# Patient Record
Sex: Female | Born: 1980 | Race: White | Hispanic: No | Marital: Married
Health system: Southern US, Community
[De-identification: ages and names within clinical notes are randomized; demographics above are authoritative.]

---

## 2021-06-03 ENCOUNTER — Emergency Department (HOSPITAL_COMMUNITY)
Admission: EM | Admit: 2021-06-03 | Discharge: 2021-06-03 | Disposition: A | Discharge status: Home / Self Care | Payer: Self-pay | Attending: Emergency Medicine | Admitting: Emergency Medicine

## 2021-06-03 ENCOUNTER — Emergency Department (HOSPITAL_COMMUNITY): Payer: Self-pay

## 2021-06-03 ENCOUNTER — Other Ambulatory Visit: Payer: Self-pay

## 2021-06-03 ENCOUNTER — Encounter (HOSPITAL_COMMUNITY): Payer: Self-pay | Admitting: Emergency Medicine

## 2021-06-03 DIAGNOSIS — R52 Pain, unspecified: Secondary | ICD-10-CM

## 2021-06-03 DIAGNOSIS — L02419 Cutaneous abscess of limb, unspecified: Secondary | ICD-10-CM

## 2021-06-03 DIAGNOSIS — L02411 Cutaneous abscess of right axilla: Secondary | ICD-10-CM | POA: Insufficient documentation

## 2021-06-03 LAB — CBC WITH DIFFERENTIAL/PLATELET
Abs Immature Granulocytes: 0.05 10*3/uL (ref 0.00–0.07)
Basophils Absolute: 0.1 10*3/uL (ref 0.0–0.1)
Basophils Relative: 1 %
Eosinophils Absolute: 0.1 10*3/uL (ref 0.0–0.5)
Eosinophils Relative: 1 %
HCT: 33.5 % — ABNORMAL LOW (ref 36.0–46.0)
Hemoglobin: 10.8 g/dL — ABNORMAL LOW (ref 12.0–15.0)
Immature Granulocytes: 1 %
Lymphocytes Relative: 18 %
Lymphs Abs: 1.7 10*3/uL (ref 0.7–4.0)
MCH: 28.6 pg (ref 26.0–34.0)
MCHC: 32.2 g/dL (ref 30.0–36.0)
MCV: 88.6 fL (ref 80.0–100.0)
Monocytes Absolute: 1.2 10*3/uL — ABNORMAL HIGH (ref 0.1–1.0)
Monocytes Relative: 12 %
Neutro Abs: 6.5 10*3/uL (ref 1.7–7.7)
Neutrophils Relative %: 67 %
Platelets: 312 10*3/uL (ref 150–400)
RBC: 3.78 MIL/uL — ABNORMAL LOW (ref 3.87–5.11)
RDW: 12.5 % (ref 11.5–15.5)
WBC: 9.6 10*3/uL (ref 4.0–10.5)
nRBC: 0 % (ref 0.0–0.2)

## 2021-06-03 LAB — URINALYSIS, ROUTINE W REFLEX MICROSCOPIC
Bilirubin Urine: NEGATIVE
Glucose, UA: NEGATIVE mg/dL
Hgb urine dipstick: NEGATIVE
Ketones, ur: NEGATIVE mg/dL
Nitrite: NEGATIVE
Protein, ur: 30 mg/dL — AB
Specific Gravity, Urine: 1.027 (ref 1.005–1.030)
Squamous Epithelial / HPF: >50 — ABNORMAL HIGH (ref 0–5)
pH: 5 (ref 5.0–8.0)

## 2021-06-03 LAB — APTT: aPTT: 30 seconds (ref 24–36)

## 2021-06-03 LAB — RAPID URINE DRUG SCREEN, HOSP PERFORMED
Amphetamines: POSITIVE — AB
Barbiturates: NOT DETECTED
Benzodiazepines: NOT DETECTED
Cocaine: POSITIVE — AB
Opiates: NOT DETECTED
Tetrahydrocannabinol: POSITIVE — AB

## 2021-06-03 LAB — COMPREHENSIVE METABOLIC PANEL
ALT: 6 U/L (ref 0–44)
AST: 12 U/L — ABNORMAL LOW (ref 15–41)
Albumin: 3.3 g/dL — ABNORMAL LOW (ref 3.5–5.0)
Alkaline Phosphatase: 76 U/L (ref 38–126)
Anion gap: 6 (ref 5–15)
BUN: 10 mg/dL (ref 6–20)
CO2: 28 mmol/L (ref 22–32)
Calcium: 9 mg/dL (ref 8.9–10.3)
Chloride: 102 mmol/L (ref 98–111)
Creatinine, Ser: 0.63 mg/dL (ref 0.44–1.00)
GFR, Estimated: >60 mL/min (ref >60)
Glucose, Bld: 113 mg/dL — ABNORMAL HIGH (ref 70–99)
Potassium: 2.9 mmol/L — ABNORMAL LOW (ref 3.5–5.1)
Sodium: 136 mmol/L (ref 135–145)
Total Bilirubin: 0.3 mg/dL (ref 0.3–1.2)
Total Protein: 7.4 g/dL (ref 6.5–8.1)

## 2021-06-03 LAB — I-STAT BETA HCG BLOOD, ED (MC, WL, AP ONLY): I-stat hCG, quantitative: <5 m[IU]/mL (ref <5)

## 2021-06-03 LAB — LACTIC ACID, PLASMA
Lactic Acid, Venous: 0.9 mmol/L (ref 0.5–1.9)
Lactic Acid, Venous: 0.7 mmol/L (ref 0.5–1.9)

## 2021-06-03 LAB — PROTIME-INR
INR: 1.1 (ref 0.8–1.2)
Prothrombin Time: 14.5 seconds (ref 11.4–15.2)

## 2021-06-03 MED ORDER — LIDOCAINE-EPINEPHRINE (PF) 2 %-1:200000 IJ SOLN
10.0000 mL | Freq: Once | INTRAMUSCULAR | Status: AC
  Administered 2021-06-03: 10 mL
  Filled 2021-06-03: qty 20

## 2021-06-03 MED ORDER — LACTATED RINGERS IV SOLN: INTRAVENOUS | Status: DC

## 2021-06-03 MED ORDER — CEPHALEXIN 500 MG PO CAPS: 500.0000 mg | ORAL_CAPSULE | Freq: Four times a day (QID) | ORAL | 0 refills | Status: AC

## 2021-06-03 MED ORDER — CEPHALEXIN 250 MG PO CAPS
500.0000 mg | ORAL_CAPSULE | Freq: Once | ORAL | Status: AC
  Administered 2021-06-03: 500 mg via ORAL
  Filled 2021-06-03: qty 2

## 2021-06-03 MED ORDER — HYDROCODONE-ACETAMINOPHEN 5-325 MG PO TABS
1.0000 | ORAL_TABLET | Freq: Once | ORAL | Status: AC
  Administered 2021-06-03: 1 via ORAL
  Filled 2021-06-03: qty 1

## 2021-06-03 MED ORDER — DEXTROSE 5 % IV SOLN
1500.0000 mg | Freq: Once | INTRAVENOUS | Status: AC
  Administered 2021-06-03: 1500 mg via INTRAVENOUS
  Filled 2021-06-03: qty 75

## 2021-06-03 MED ORDER — FENTANYL CITRATE (PF) 100 MCG/2ML IJ SOLN
50.0000 ug | Freq: Once | INTRAMUSCULAR | Status: AC
  Administered 2021-06-03: 50 ug via INTRAVENOUS
  Filled 2021-06-03: qty 2

## 2021-06-03 NOTE — Progress Notes (Signed)
Pharmacy Note:  Dalbavancin for Acute Bacterial Skin and Skin Structure Infection (ABSSSI) Patients to [facility] Discharge Taylor Compton is an 40 y.o. female who presented to [facility] on 06/03/2021 with an Acute Bacterial Skin and Skin Structure Infection  Inclusion criteria - Indication []  Moderately large skin lesion (>=75 cm2 or larger - about the size of a baseball) [x]  Cellulitis  Inclusion Criteria - at least one SIRS criteria present []  WBC > 12,000 or < 4000 []  temp >100.9 or < 96.8 [x]  heart rate >90[]  respiratory rate >20  Patient was evaluated for the following exclusion criteria and no exclusions were found  Hardware involvement, Hypotension / shock, Elevated lactate (>2) without other explanation, ram-negative infection risk factors (bites, water exposure, infection after trauma, infection after skin graft, neutropenia, burns, severe immunocompromise), necrotizing fasciitis possible or confirmed, Known or suspected osteomyelitis or septic arthritis, endocarditis, diabetic foot infection, ischemic ulcers, post-operative wound infection, perirectal infections, need for drainage in the operating room, hand or facial infections, injection drug users with a fever, bacteremia, pregnancy or breastfeeding, allergy to related antibiotics like vancomycin, known liver disease (t.bili >2x ULN or AST/ALT 3x ULN)  , PharmD Clinical Pharmacist  06/03/2021, 8:07 AM Clinical Pharmacist

## 2021-06-03 NOTE — ED Notes (Signed)
Patient called for xray by xray tech and sort tech. Patient not visible in lobby

## 2021-06-03 NOTE — ED Notes (Signed)
Pt not in ED room 20

## 2021-06-03 NOTE — Discharge Instructions (Signed)
You are seen in the ER for abscess to the axillary region.  We drained the abscess.  You have received IV antibiotic, which should help with the cellulitis treatment.  Additionally, take Keflex for the next 5 days.  We would like you to follow-up with the infectious disease doctors as indicated in your paperwork.  Return to the ER if you start having fevers, chills, worsening pain, worsening swelling.  For now, warm compresses 3 or 4 times a day for 15 to 30 minutes should be helpful at the site of the abscess drainage.  Keep the wound site clean and dry.

## 2021-06-03 NOTE — ED Triage Notes (Addendum)
Pt reports an abscess that appeared three days ago under her right arm.  The arm pain and swelling and redness has moved down to her elbow which she found it hard to open.    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  No fevers, chills or other symptoms.

## 2021-06-03 NOTE — ED Notes (Signed)
Received pt to ED room 20

## 2021-06-03 NOTE — ED Provider Notes (Signed)
Emergency Medicine Provider Triage Evaluation Note  Taylor Compton , a 40 y.o. female  was evaluated in triage.  Pt complains of right arm pain and swelling. Pt reports she initially had swelling and pain in the right axilla 3 days ago.  Pt states she tried to squeeze it but nothing came out.  Today redness, pain and warmth have extended down the right upper arm to the right elbow.  Pt with decreased ROM of the right elbow.  Last IVDU was 1 week ago.  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  Associated nausea and lightheadedness today.  NO syncope  Review of Systems  Positive: Redness, elbow pain, lightheadedness, nausea Negative: Vomiting, fevers, weakness  Physical Exam  BP 111/73 (BP Location: Left Arm)   Pulse 91   Temp 98.5 F (36.9 C) (Oral)   Resp 16   Ht 5\' 5"  (1.651 m)   Wt 53.5 kg   SpO2 96%   BMI 19.64 kg/m   Gen:   Awake, no distress  Resp:  Normal effort  MSK:   Decreased ROM of the right elbow due to severe pain Other:  Redness, tenderness and induration of the right axilla with extending erythema to the right upper arm  Medical Decision Making  Medically screening exam initiated at 2:59 AM.  Appropriate orders placed.  Taylor Compton was informed that the remainder of the evaluation will be completed by another provider, this initial triage assessment does not replace that evaluation, and the importance of remaining in the ED until their evaluation is complete.  Concern for evolving sepsis 2/2 IVDU.  Labs, blood cultures and imaging placed.  Vitals within normal limits at this time. No code sepsis called.   Taylor Compton, Taylor Compton 06/03/21 08/03/21    [id number], MD 06/03/21 [phone]

## 2021-06-03 NOTE — ED Provider Notes (Addendum)
[facility] EMERGENCY DEPARTMENT Provider Note   CSN: [id number] Arrival date & time: 06/03/21  0039     History Chief Complaint  Patient presents with  . Abscess    Taylor Compton is a 40 y.o. female.  HPI     40 year old female comes in a chief complaint of abscess and arm pain.  She has history of polysubstance abuse.  Patient reports that she started noticing some swelling below her armpit 3 days ago.  2 days ago there was a whitehead that she drained pus from.  Yesterday she started having increased swelling and redness that is now progressed up to the elbow, prompting her to come to the ER.  Review of system is negative for any nausea, vomiting, fevers, chills.  History reviewed. No pertinent past medical history.  There are no problems to display for this patient.   History reviewed. No pertinent surgical history.   OB History   No obstetric history on file.     No family history on file.     Home Medications Prior to Admission medications   Medication Sig Start Date End Date Taking? Authorizing Provider  cephALEXin (KEFLEX) 500 MG capsule Take 1 capsule (500 mg total) by mouth 4 (four) times daily. 06/03/21  Yes Derwood Kaplan, MD    Allergies    Patient has no known allergies.  Review of Systems   Review of Systems  Constitutional: Positive for activity change.  Musculoskeletal: Positive for arthralgias.  Skin: Positive for wound.  Allergic/Immunologic: Negative for immunocompromised state.  Neurological: Positive for numbness.  Hematological: Does not bruise/bleed easily.  All other systems reviewed and are negative.   Physical Exam Updated Vital Signs BP 110/77 (BP Location: Left Arm)   Pulse 86   Temp 98.6 F (37 C) (Oral)   Resp 14   Ht 5\' 5"  (1.651 m)   Wt 53.5 kg   SpO2 100%   BMI 19.64 kg/m   Physical Exam Vitals and nursing note reviewed.  Constitutional:      Appearance: She is well-developed.  HENT:      Head: Normocephalic and atraumatic.  Cardiovascular:     Rate and Rhythm: Normal rate.  Pulmonary:     Effort: Pulmonary effort is normal.  Abdominal:     General: Bowel sounds are normal.  Musculoskeletal:        General: Normal range of motion.     Cervical back: Normal range of motion and neck supple.     Comments: Patient has fluctuance over the axillary region at two  different sites and induration above those fluctuant lesions.  Skin:    General: Skin is warm and dry.     Findings: Erythema and rash present.     Comments: Erythematous rash from axilla to elbow, not circumferential  Neurological:     Mental Status: She is alert and oriented to person, place, and time.     ED Results / Procedures / Treatments   Labs (all labs ordered are listed, but only abnormal results are displayed) Labs Reviewed  COMPREHENSIVE METABOLIC PANEL - Abnormal; Notable for the following components:      Result Value   Potassium 2.9 (*)    Glucose, Bld 113 (*)    Albumin 3.3 (*)    AST 12 (*)    All other components within normal limits  CBC WITH DIFFERENTIAL/PLATELET - Abnormal; Notable for the following components:   RBC 3.78 (*)    Hemoglobin 10.8 (*)  HCT 33.5 (*)    Monocytes Absolute 1.2 (*)    All other components within normal limits  URINALYSIS, ROUTINE W REFLEX MICROSCOPIC - Abnormal; Notable for the following components:   Color, Urine AMBER (*)    APPearance TURBID (*)    Protein, ur 30 (*)    Leukocytes,Ua TRACE (*)    Bacteria, UA MANY (*)    Squamous Epithelial / LPF >50 (*)    All other components within normal limits  RAPID URINE DRUG SCREEN, HOSP PERFORMED - Abnormal; Notable for the following components:   Cocaine POSITIVE (*)    Amphetamines POSITIVE (*)    Tetrahydrocannabinol POSITIVE (*)    All other components within normal limits  CULTURE, BLOOD (SINGLE)  URINE CULTURE  LACTIC ACID, PLASMA  LACTIC ACID, PLASMA  PROTIME-INR  APTT  I-STAT BETA HCG  BLOOD, ED ([facility], [facility], [facility] ONLY)    EKG EKG Interpretation  Date/Time:  Sunday June 03 2021 08:37:05 EDT Ventricular Rate:  67 PR Interval:  176 QRS Duration: 91 QT Interval:  445 QTC Calculation: 470 R Axis:   89 Text Interpretation: Sinus rhythm No acute changes No significant change since last tracing Confirmed by Derwood Kaplan ([id number]) on 06/03/2021 10:15:05 AM   Radiology DG Chest 1 View  Result Date: 06/03/2021 CLINICAL DATA:  40 year old female with chest pain and possible sepsis. Reports abscess under right arm. EXAM: CHEST  1 VIEW COMPARISON:  None. FINDINGS: Seated upright AP view at 0352 hours. Normal lung volumes and mediastinal contours. Visualized tracheal air column is within normal limits. Allowing for portable technique the lungs are clear. No pneumothorax or pleural effusion. No osseous abnormality identified. Negative visible bowel gas pattern. No right axillary soft tissue gas. IMPRESSION: Negative portable chest. Electronically Signed   By: Odessa Fleming M.D.   On: 06/03/2021 04:46   DG Elbow Complete Right  Result Date: 06/03/2021 CLINICAL DATA:  40 year old female with chest pain and possible sepsis. Reports abscess under right arm. EXAM: RIGHT ELBOW - COMPLETE 3+ VIEW COMPARISON:  None. FINDINGS: Bone mineralization is within normal limits. There is no evidence of fracture, dislocation, or joint effusion. There is no evidence of arthropathy or other focal bone abnormality. Soft tissue swelling and stranding along the medial and dorsal distal humerus. No soft tissue gas. No radiopaque foreign body identified. IMPRESSION: Soft tissue swelling and stranding medial and dorsal to the distal humerus. No acute osseous abnormality identified. Electronically Signed   By: Odessa Fleming M.D.   On: 06/03/2021 04:47    Procedures .Marland KitchenIncision and Drainage  Date/Time: 06/03/2021 10:18 AM Performed by: Derwood Kaplan, MD Authorized by: Derwood Kaplan, MD   Consent:    Consent obtained:  Verbal    Consent given by:  Patient   Risks discussed:  Bleeding, incomplete drainage and pain   Alternatives discussed:  No treatment Universal protocol:    Procedure explained and questions answered to patient or proxy's satisfaction: yes     Patient identity confirmed:  Arm band Location:    Type:  Abscess   Size:  4   Location:  Upper extremity   Upper extremity location: AXILLA. Pre-procedure details:    Skin preparation:  Chlorhexidine Sedation:    Sedation type:  None Anesthesia:    Anesthesia method:  Topical application and local infiltration   Local anesthetic:  Lidocaine 1% WITH epi Procedure type:    Complexity:  Complex Procedure details:    Ultrasound guidance: no     Incision types:  Stab incision   Incision depth:  Subcutaneous   Wound management:  Probed and deloculated, irrigated with saline and extensive cleaning   Drainage:  Purulent and serosanguinous   Drainage amount:  Copious   Packing materials:  None Post-procedure details:    Procedure completion:  Procedure terminated at patient's request     Medications Ordered in ED Medications  lactated ringers infusion ( Intravenous New Bag/Given 06/03/21 0859)  cephALEXin (KEFLEX) capsule 500 mg (has no administration in time range)  HYDROcodone-acetaminophen (NORCO/VICODIN) 5-325 MG per tablet 1 tablet (has no administration in time range)  lidocaine-EPINEPHrine (XYLOCAINE W/EPI) 2 %-1:200000 (PF) injection 10 mL (10 mLs Infiltration Given 06/03/21 0924)  dalbavancin (DALVANCE) 1,500 mg in dextrose 5 % 500 mL IVPB (1,500 mg Intravenous New Bag/Given 06/03/21 1014)  fentaNYL (SUBLIMAZE) injection 50 mcg (50 mcg Intravenous Given 06/03/21 4709)    ED Course  I have reviewed the triage vital signs and the nursing notes.  Pertinent labs & imaging results that were available during my care of the patient were reviewed by me and considered in my medical decision making (see chart for details).    MDM  Rules/Calculators/A&P                          40 year old comes in with chief complaint of axillary abscess and arm pain.  She has history of polysubstance abuse.   On exam, it appears that she might have 2 areas of abscess, and lymphadenopathy.  I&D was performed at the bedside, abscess drained from 1 side in its entirety.  The other side patient was not keen on getting drained because of pain that she suffered from the first I&D.  She did allow for needle aspiration, which did not reveal pus on that side.  No systemic symptoms.  Doubt endocarditis at this time. Likely has lymphadenitis, cellulitis and abscess. IV Dalvance ordered.  She has been advised to return to the ER if her symptoms get worse or if you start developing new symptoms such as fevers, chills. Otherwise she can follow-up with the ID doctor as per the Dalvance protocol.  We have not packed the lesion.  Patient will perform warm compresses.  Final Clinical Impression(s) / ED Diagnoses Final diagnoses:  Axillary abscess    Rx / DC Orders ED Discharge Orders         Ordered    Ambulatory referral to Infectious Disease       Comments: Cellulitis patient:  Received dalbavancin on 06/03/2021.   06/03/21 0750    cephALEXin (KEFLEX) 500 MG capsule  4 times daily        06/03/21 1051              Derwood Kaplan, MD 06/03/21 1059

## 2021-06-03 NOTE — ED Notes (Signed)
Went in to get pt an ekg and to set pt up on monitor. However pt was in the bathroom approximently 30 minutes with her purse. Went to hook her up pt appears to be out and asleep and takes a lot to wake her up. MD aware

## 2021-06-03 NOTE — ED Notes (Addendum)
As per triage nurse: "Pt reports an abscess that appeared three days ago under her right arm.  The arm pain and swelling and redness has moved down to her elbow which she found it hard to open.    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  No fevers, chills or other symptoms.

## 2021-06-03 NOTE — ED Notes (Signed)
Patient back from outside 

## 2021-06-03 NOTE — ED Notes (Signed)
ED Provider at bedside. 

## 2021-06-04 ENCOUNTER — Other Ambulatory Visit (HOSPITAL_COMMUNITY): Payer: Self-pay

## 2021-06-04 LAB — URINE CULTURE: Culture: <10000 — AB

## 2021-06-06 ENCOUNTER — Ambulatory Visit: Payer: Self-pay | Admitting: Internal Medicine

## 2021-06-08 LAB — CULTURE, BLOOD (SINGLE)
Culture: NO GROWTH
Special Requests: ADEQUATE

## 2022-10-05 IMAGING — CR DG ELBOW COMPLETE 3+V*R*
4 series · 4 of 4 positions shown · non-contrast
Comparison: None.

CLINICAL DATA: 40-year-old female with chest pain and possible
sepsis. Reports abscess under right arm.

EXAM:
RIGHT ELBOW - COMPLETE 3+ VIEW

[elbow ap]
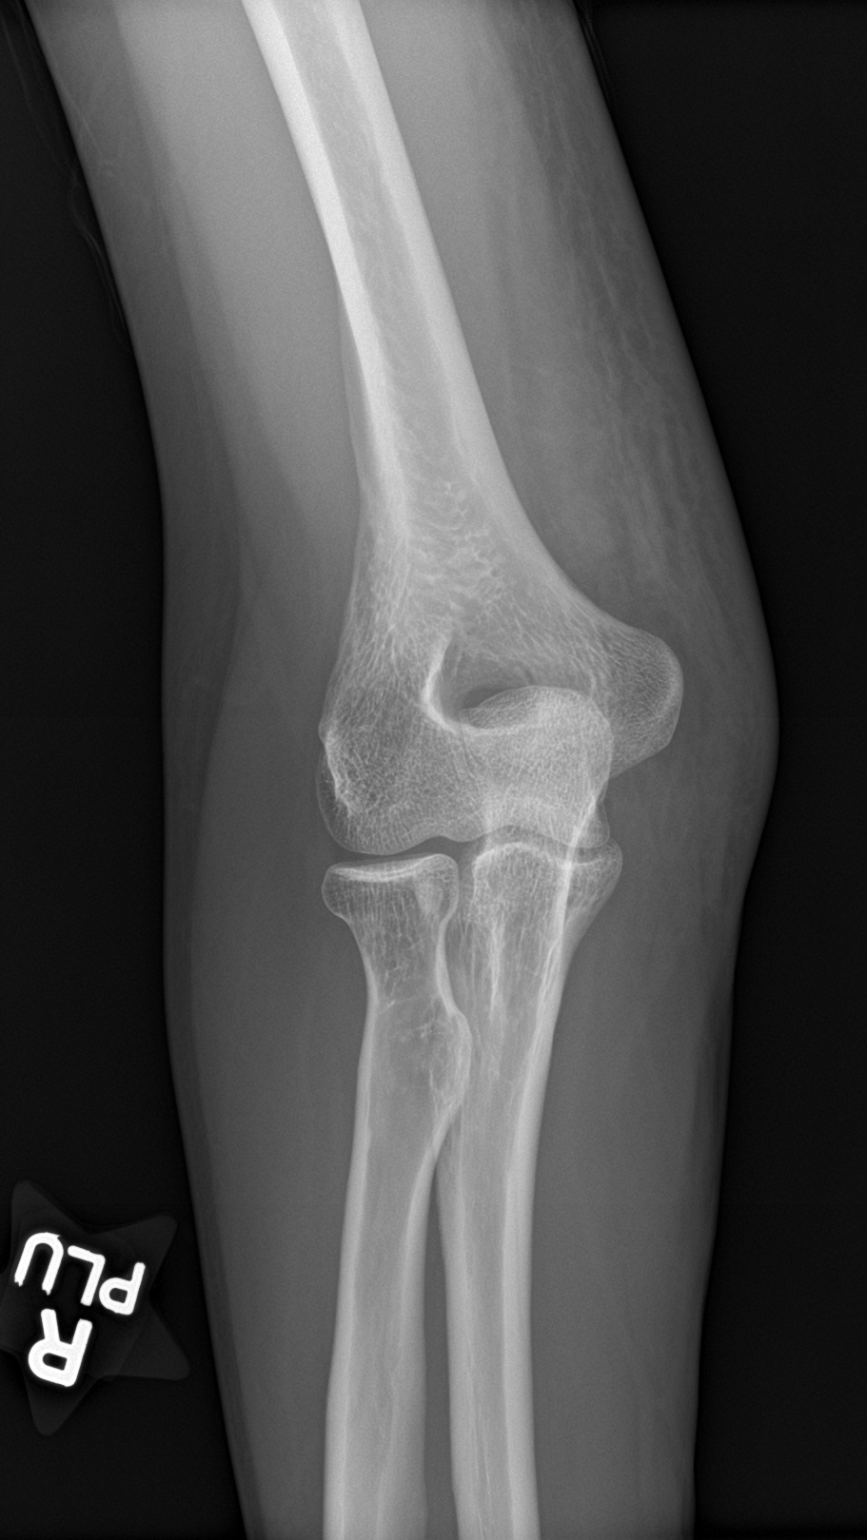

[elbow obl (1 of 2)]
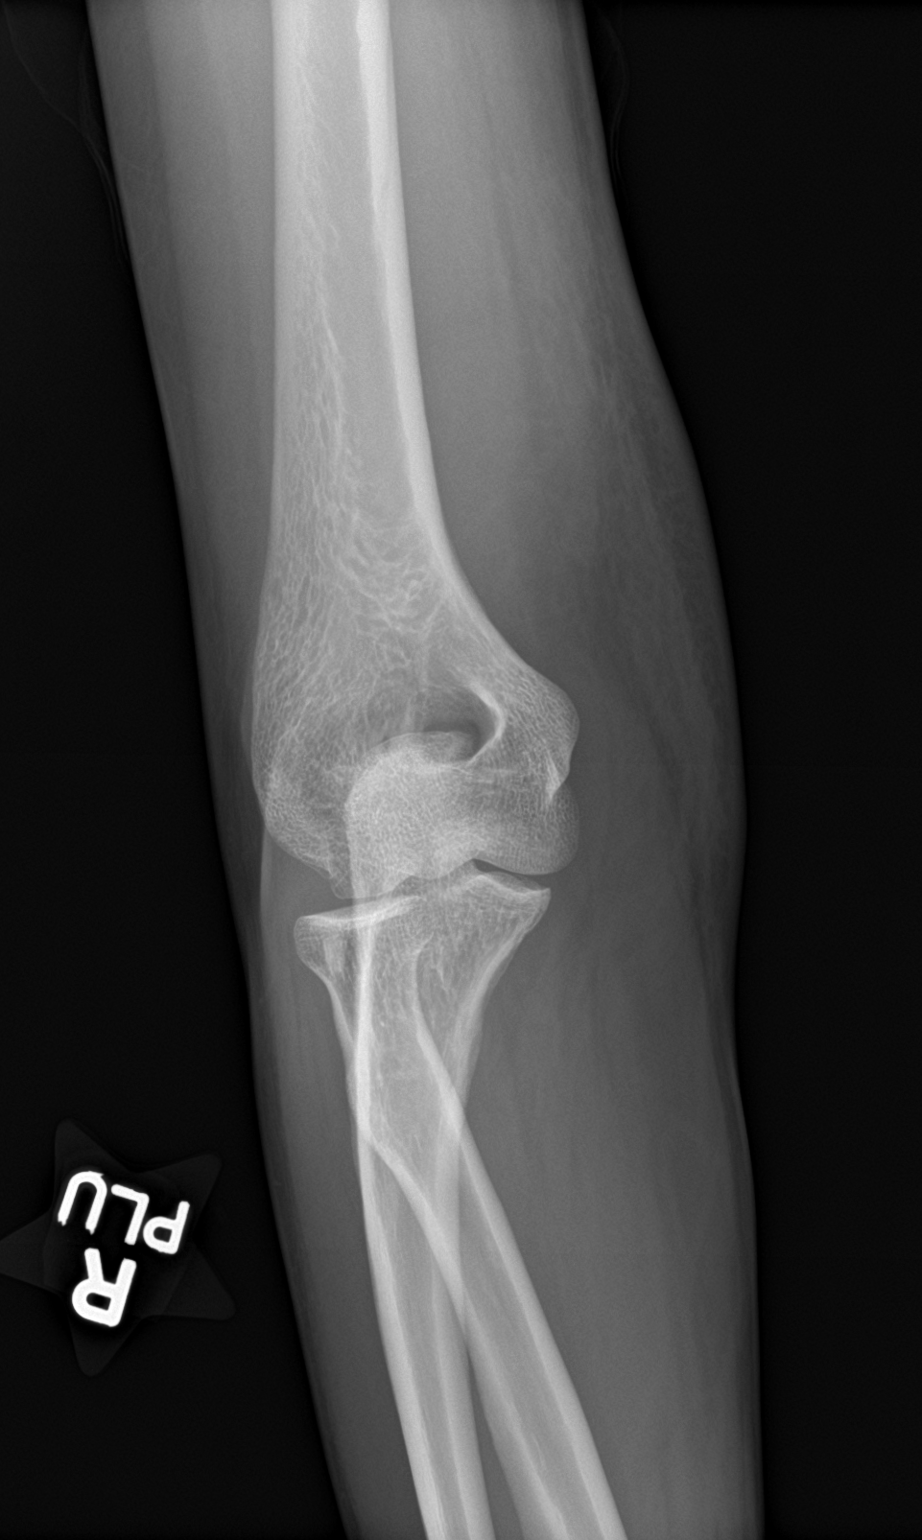

[elbow obl (2 of 2)]
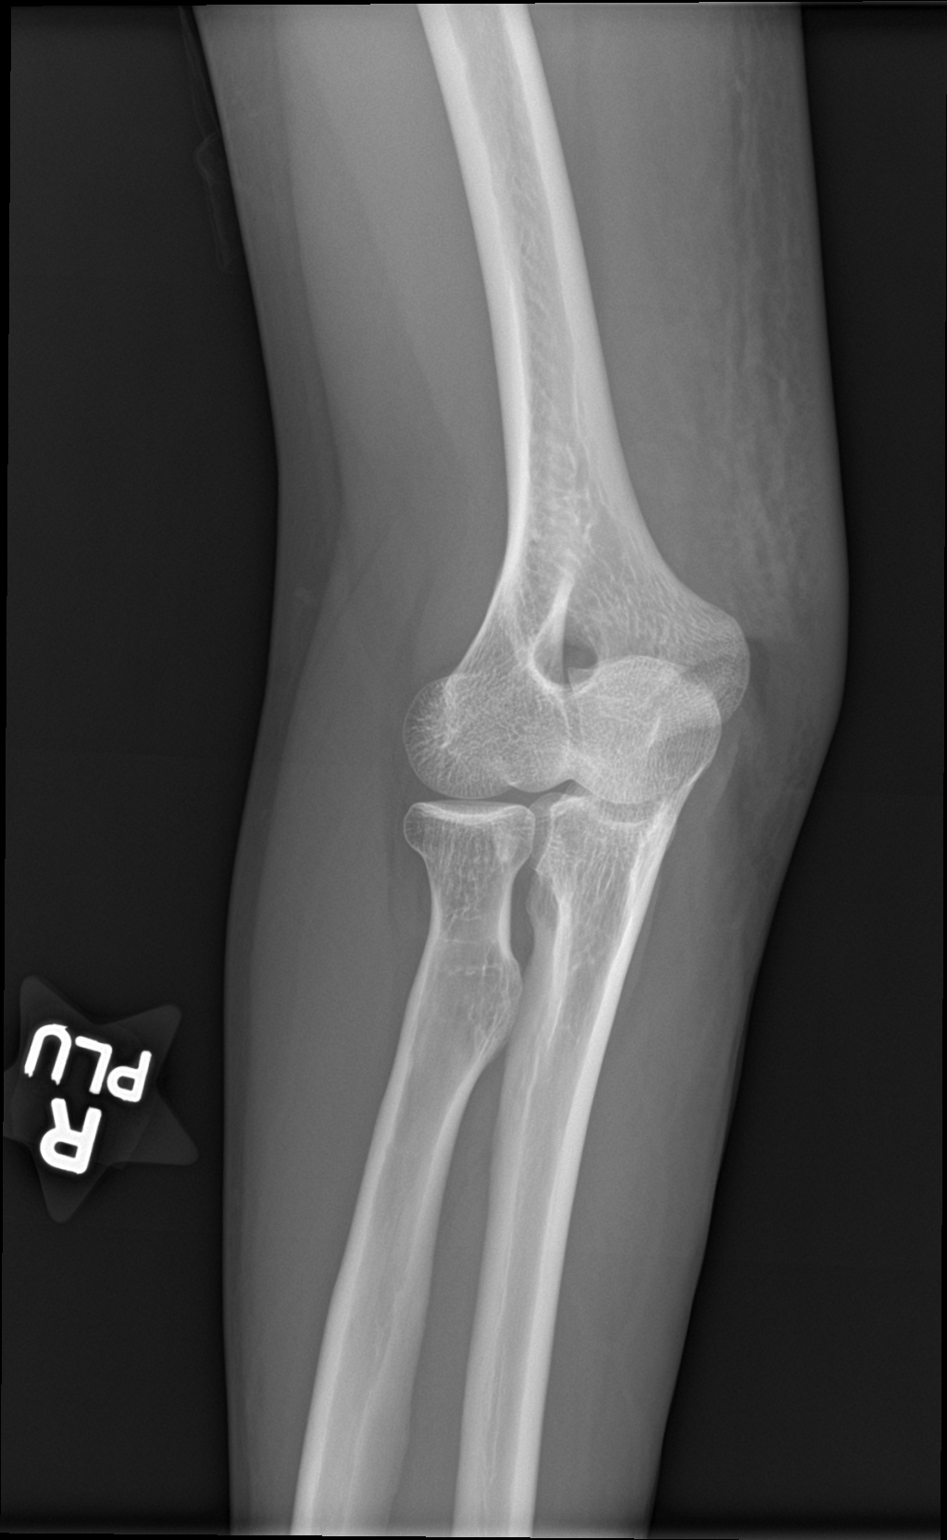

[elbow lat]
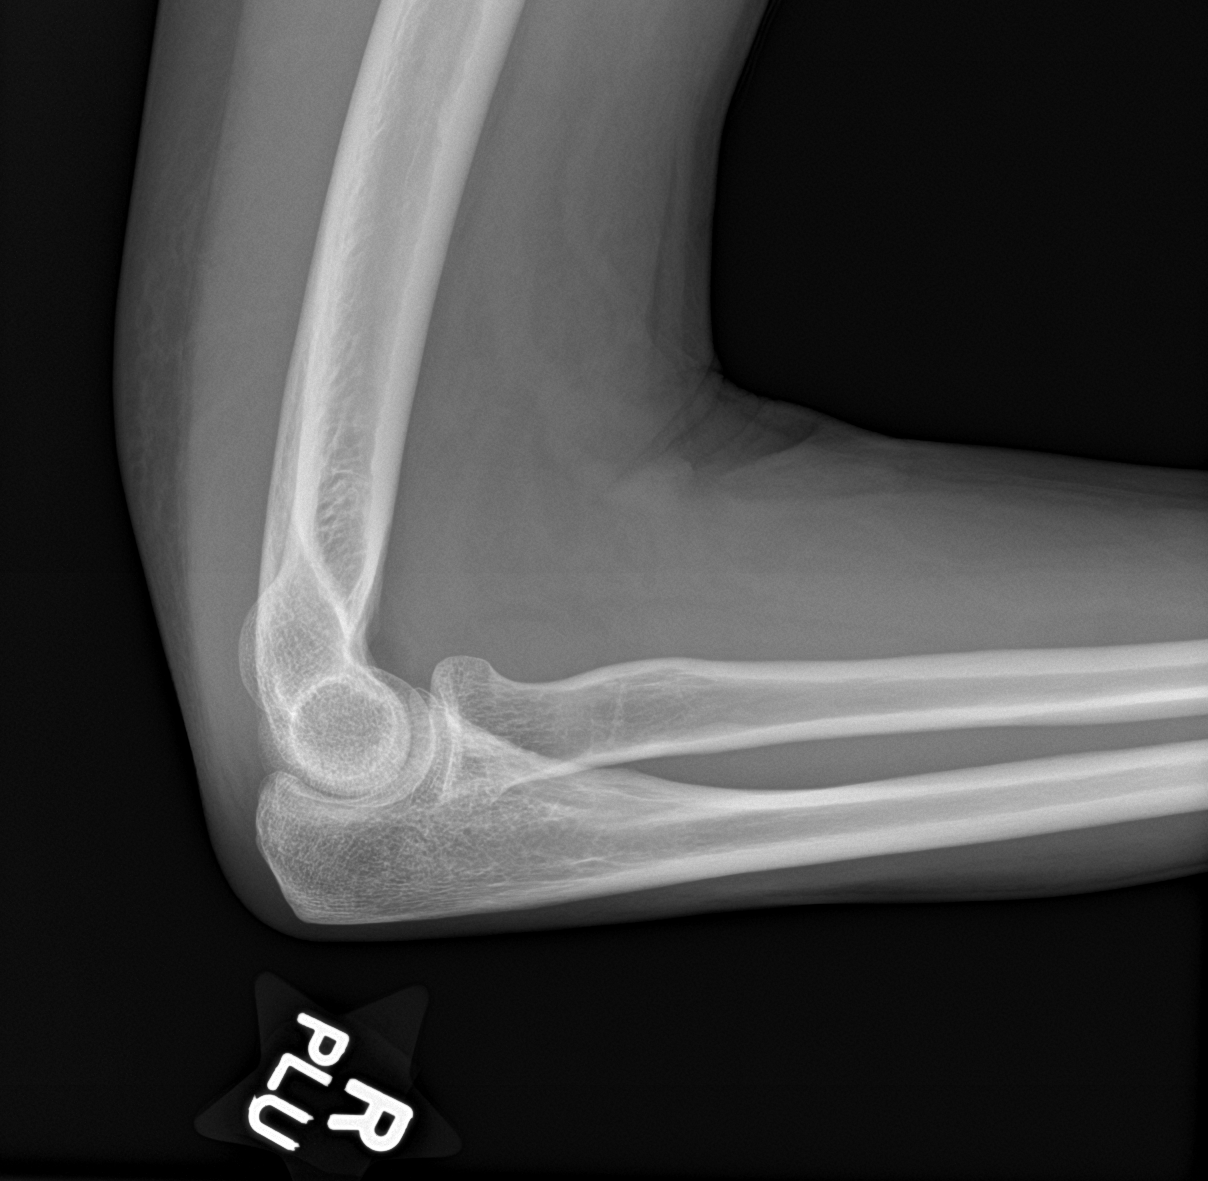

[4 of 4 positions shown; findings below may reference images not displayed]

FINDINGS: Bone mineralization is within normal limits. There is no evidence of
fracture, dislocation, or joint effusion. There is no evidence of
arthropathy or other focal bone abnormality. Soft tissue swelling
and stranding along the medial and dorsal distal humerus. No soft
tissue gas. No radiopaque foreign body identified.
IMPRESSION: Soft tissue swelling and stranding medial and dorsal to the distal
humerus. No acute osseous abnormality identified.
